# Patient Record
Sex: Male | Born: 2000
Health system: Southern US, Community
[De-identification: ages and names within clinical notes are randomized; demographics above are authoritative.]

## PROBLEM LIST (undated history)

## (undated) DIAGNOSIS — IMO0001 Reserved for inherently not codable concepts without codable children: Secondary | ICD-10-CM

## (undated) HISTORY — PX: OTHER SURGICAL HISTORY: SHX169

## (undated) HISTORY — PX: HYPOSPADIAS CORRECTION: SHX483

## (undated) HISTORY — PX: CIRCUMCISION: SUR203

---
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
## 2023-12-07 ENCOUNTER — Encounter (HOSPITAL_BASED_OUTPATIENT_CLINIC_OR_DEPARTMENT_OTHER): Payer: Self-pay | Admitting: Emergency Medicine

## 2023-12-07 ENCOUNTER — Emergency Department (HOSPITAL_BASED_OUTPATIENT_CLINIC_OR_DEPARTMENT_OTHER)
Admission: EM | Admit: 2023-12-07 | Discharge: 2023-12-07 | Disposition: A | Discharge status: Home / Self Care | Payer: 59 | Attending: Emergency Medicine | Admitting: Emergency Medicine

## 2023-12-07 ENCOUNTER — Other Ambulatory Visit: Payer: Self-pay

## 2023-12-07 DIAGNOSIS — Z20822 Contact with and (suspected) exposure to covid-19: Secondary | ICD-10-CM | POA: Diagnosis not present

## 2023-12-07 DIAGNOSIS — J101 Influenza due to other identified influenza virus with other respiratory manifestations: Secondary | ICD-10-CM | POA: Insufficient documentation

## 2023-12-07 DIAGNOSIS — R059 Cough, unspecified: Secondary | ICD-10-CM | POA: Diagnosis present

## 2023-12-07 LAB — RESP PANEL BY RT-PCR (RSV, FLU A&B, COVID)  RVPGX2
Influenza A by PCR: POSITIVE — AB
Influenza B by PCR: NEGATIVE
Resp Syncytial Virus by PCR: NEGATIVE
SARS Coronavirus 2 by RT PCR: NEGATIVE

## 2023-12-07 NOTE — Discharge Instructions (Signed)
As we discussed you have flu A.  Continue Tylenol and Motrin for fever   You can try Afrin for nasal congestion   See your doctor for follow-up  Return to ER if you have worse cough or congestion or trouble breathing

## 2023-12-07 NOTE — ED Triage Notes (Signed)
Pt reports cough, headache, congestion and chills since Monday.  Pt took Advil and benadryl which did help w/ the fever.

## 2023-12-07 NOTE — ED Provider Notes (Signed)
[place] EMERGENCY DEPARTMENT AT [facility] Provider Note   CSN: [id number] Arrival date & time: 12/07/23  2000     History  Chief Complaint  Patient presents with   Cough   Headache    Justin Werner is a 23 y.o. male here presenting with cough and headaches.  Symptoms going on for about a week or so.  Patient also has some sinus congestion.  Patient has multiple sick contacts at work.  Patient is otherwise healthy.  Since symptoms has not improved, family wanted him to get checked out  The history is provided by the patient.       Home Medications Prior to Admission medications   Medication Sig Start Date End Date Taking? Authorizing Provider  Multiple Vitamins-Minerals (MULTIVITAMINS THER. W/MINERALS) TABS Take 1 tablet by mouth daily.      [provider]      Allergies    Patient has no known allergies.    Review of Systems   Review of Systems  Respiratory:  Positive for cough.   Neurological:  Positive for headaches.  All other systems reviewed and are negative.   Physical Exam Updated Vital Signs BP (!) 142/86 (BP Location: Right Arm)   Pulse 82   Temp 98.8 F (37.1 C)   Resp 18   Ht 6' (1.829 m)   Wt 97.5 kg   SpO2 97%   BMI 29.16 kg/m  Physical Exam Vitals and nursing note reviewed.  Constitutional:      Appearance: He is well-developed.  HENT:     Head: Normocephalic.  Eyes:     Extraocular Movements: Extraocular movements intact.     Pupils: Pupils are equal, round, and reactive to light.  Cardiovascular:     Rate and Rhythm: Normal rate and regular rhythm.  Pulmonary:     Effort: Pulmonary effort is normal.     Breath sounds: Normal breath sounds.  Musculoskeletal:        General: Normal range of motion.     Cervical back: Normal range of motion.  Skin:    General: Skin is warm.  Neurological:     Mental Status: He is alert.  Psychiatric:        Mood and Affect: Mood normal.     ED Results / Procedures /  Treatments   Labs (all labs ordered are listed, but only abnormal results are displayed) Labs Reviewed  RESP PANEL BY RT-PCR (RSV, FLU A&B, COVID)  RVPGX2 - Abnormal; Notable for the following components:      Result Value   Influenza A by PCR POSITIVE (*)    All other components within normal limits    EKG None  Radiology No results found.  Procedures Procedures    Medications Ordered in ED Medications - No data to display  ED Course/ Medical Decision Making/ A&P                                 Medical Decision Making [name] is a 23 y.o. male here presenting with cough and headache.  Patient is well-appearing.  Lungs are clear.  Patient is flu a positive.  He is outside the treatment window for Tamiflu.  Stable for discharge   Problems Addressed: Influenza A: acute illness or injury  Amount and/or Complexity of Data Reviewed Labs: ordered. Decision-making details documented in ED Course.    Final Clinical Impression(s) / ED Diagnoses  Final diagnoses:  None    Rx / DC Orders ED Discharge Orders     None         Charlynne Pander, MD 12/07/23 2145

## 2024-05-11 ENCOUNTER — Encounter (HOSPITAL_BASED_OUTPATIENT_CLINIC_OR_DEPARTMENT_OTHER): Payer: Self-pay | Admitting: Emergency Medicine

## 2024-05-11 ENCOUNTER — Emergency Department (HOSPITAL_BASED_OUTPATIENT_CLINIC_OR_DEPARTMENT_OTHER)
Admission: EM | Admit: 2024-05-11 | Discharge: 2024-05-11 | Disposition: A | Discharge status: Home / Self Care | Payer: Worker's Compensation

## 2024-05-11 DIAGNOSIS — W450XXA Nail entering through skin, initial encounter: Secondary | ICD-10-CM | POA: Diagnosis not present

## 2024-05-11 DIAGNOSIS — Y990 Civilian activity done for income or pay: Secondary | ICD-10-CM | POA: Diagnosis not present

## 2024-05-11 DIAGNOSIS — T148XXA Other injury of unspecified body region, initial encounter: Secondary | ICD-10-CM

## 2024-05-11 DIAGNOSIS — Z23 Encounter for immunization: Secondary | ICD-10-CM | POA: Insufficient documentation

## 2024-05-11 DIAGNOSIS — S31811A Laceration without foreign body of right buttock, initial encounter: Secondary | ICD-10-CM | POA: Insufficient documentation

## 2024-05-11 MED ORDER — TETANUS-DIPHTH-ACELL PERTUSSIS 5-2.5-18.5 LF-MCG/0.5 IM SUSY
0.5000 mL | PREFILLED_SYRINGE | Freq: Once | INTRAMUSCULAR | Status: AC
  Administered 2024-05-11: 0.5 mL via INTRAMUSCULAR
  Filled 2024-05-11: qty 0.5

## 2024-05-11 NOTE — ED Notes (Signed)
 DC paperwork given and verbally understood.

## 2024-05-11 NOTE — Discharge Instructions (Signed)
 Keep the area clean and dry.  Please cover the wound with Band-Aid and use over-the-counter Neosporin for the next 48 hours.  Return if develop fevers, chills, severe pain, redness around wound, foul odor, begins draining pus or appears infected.  Or, you may return if you develop any new or worsening symptoms that are concerning to you

## 2024-05-11 NOTE — ED Triage Notes (Signed)
 Scratched/ gashed on buttock by nail at work last night.  Unsure if he has had tetanus vaccine

## 2024-05-11 NOTE — ED Provider Notes (Signed)
  [place] EMERGENCY DEPARTMENT AT [facility] Provider Note   CSN: [id number] Arrival date & time: 05/11/24  1554     Patient presents with: Laceration   Demarrio B Wrigley is a 23 y.o. male.   This is a 23 year old male presenting emergency department with superficial scratch to his right buttock with a nail that he grazed himself against at work.  Unsure his last tetanus.  Pain is variable.  No other injuries.  No other complaints.   Laceration      Prior to Admission medications   Medication Sig Start Date End Date Taking? Authorizing Provider  Multiple Vitamins-Minerals (MULTIVITAMINS THER. W/MINERALS) TABS Take 1 tablet by mouth daily.      [provider]    Allergies: Patient has no known allergies.    Review of Systems  Updated Vital Signs BP 128/71 (BP Location: Right Arm)   Pulse 70   Temp 98.7 F (37.1 C) (Oral)   Resp 17   SpO2 97%   Physical Exam  Cardiovascular:     Rate and Rhythm: Normal rate and regular rhythm.  Pulmonary:     Effort: Pulmonary effort is normal.     Breath sounds: Normal breath sounds.   Musculoskeletal:     Comments: Patient with a 2 cm superficial scratch to his right buttock that barely broke the epidermis.  No bleeding no foreign bodies.     (all labs ordered are listed, but only abnormal results are displayed) Labs Reviewed - No data to display  EKG: None  Radiology: No results found.   Procedures   Medications Ordered in the ED  Tdap (BOOSTRIX) injection 0.5 mL (has no administration in time range)                                    Medical Decision Making This is a 23 year old male otherwise healthy presenting emergency department with a superficial scratch/abrasion to his right buttock from a nail.  He is unsure his last tetanus.  Will therefore update.  Discussed local wound care.  Patient follow-up PCP.  Return precautions given. stable for discharge  Risk Prescription drug  management.       Final diagnoses:  None    ED Discharge Orders     None          Neysa Caron PARAS, DO 05/11/24 1627
